# Patient Record
Sex: Male | Born: 1955 | Race: White | Hispanic: No | Marital: Married | Smoking: Never smoker
Health system: Southern US, Community
[De-identification: ages and names within clinical notes are randomized; demographics above are authoritative.]

## PROBLEM LIST (undated history)

## (undated) DIAGNOSIS — E785 Hyperlipidemia, unspecified: Secondary | ICD-10-CM

## (undated) DIAGNOSIS — I10 Essential (primary) hypertension: Secondary | ICD-10-CM

## (undated) HISTORY — PX: KNEE ARTHROSCOPY: SHX127

## (undated) HISTORY — PX: KNEE SURGERY: SHX244

## (undated) HISTORY — PX: TONSILLECTOMY: SUR1361

## (undated) HISTORY — PX: APPENDECTOMY: SHX54

---

## 2002-03-18 ENCOUNTER — Encounter: Admission: RE | Admit: 2002-03-18 | Discharge: 2002-03-18 | Payer: Self-pay | Admitting: Occupational Medicine

## 2002-03-18 ENCOUNTER — Encounter: Payer: Self-pay | Admitting: Occupational Medicine

## 2010-08-16 ENCOUNTER — Ambulatory Visit
Admission: RE | Admit: 2010-08-16 | Discharge: 2010-08-16 | Disposition: A | Discharge status: Home / Self Care | Payer: BC Managed Care – PPO | Source: Ambulatory Visit | Attending: Emergency Medicine | Admitting: Emergency Medicine

## 2010-08-16 ENCOUNTER — Inpatient Hospital Stay (INDEPENDENT_AMBULATORY_CARE_PROVIDER_SITE_OTHER)
Admission: RE | Admit: 2010-08-16 | Discharge: 2010-08-16 | Disposition: A | Discharge status: Home / Self Care | Payer: BC Managed Care – PPO | Source: Ambulatory Visit | Attending: Emergency Medicine | Admitting: Emergency Medicine

## 2010-08-16 ENCOUNTER — Other Ambulatory Visit: Payer: Self-pay | Admitting: Emergency Medicine

## 2010-08-16 ENCOUNTER — Encounter: Payer: Self-pay | Admitting: Emergency Medicine

## 2010-08-16 DIAGNOSIS — I10 Essential (primary) hypertension: Secondary | ICD-10-CM | POA: Insufficient documentation

## 2010-08-16 DIAGNOSIS — S40019A Contusion of unspecified shoulder, initial encounter: Secondary | ICD-10-CM

## 2010-08-16 DIAGNOSIS — M25519 Pain in unspecified shoulder: Secondary | ICD-10-CM

## 2010-08-16 DIAGNOSIS — E785 Hyperlipidemia, unspecified: Secondary | ICD-10-CM | POA: Insufficient documentation

## 2010-08-20 ENCOUNTER — Telehealth (INDEPENDENT_AMBULATORY_CARE_PROVIDER_SITE_OTHER): Payer: Self-pay | Admitting: *Deleted

## 2011-02-21 NOTE — Progress Notes (Signed)
Summary: LEFT SHOULDER INJ/PAIN (rm 4)   Vital Signs:  Patient Profile:   55 Years Old Male CC:      left shoulder pain x today Height:     69 inches Weight:      214 pounds O2 Sat:      97 % O2 treatment:    Room Air Temp:     98.9 degrees F oral Pulse rate:   59 / minute Resp:     16 per minute BP sitting:   135 / 88  (right arm) Cuff size:   large  Pt. in pain?   yes    Location:   left shoulder    Type:       sharp  Vitals Entered By: Lajean Saver RN (Aug 16, 2010 1:35 PM)                   Updated Prior Medication List: LIPITOR 20 MG TABS (ATORVASTATIN CALCIUM) once daily LISINOPRIL 40 MG TABS (LISINOPRIL) once daily * VITAMIN D   Current Allergies: No known allergies History of Present Illness History from: patient Chief Complaint: left shoulder pain x today History of Present Illness: Today at apprx noon, injured L shoulder area. Was repairing a car, and the car/spare tire leaned on his Left shoulder, wedging him to the ground. No LOC. Denies neck or head trauma or pain. Pt complains of L shoulder pain. Description/Quality of Pain: ache Intensity of pain: 5/10 Modifying Factors: Hurts to move left shoulder. Tried Ibuprofen, minimal help. Symptoms Worse with: movement Better with: rest Denies numbness, weakness. No other musculoskeletal c/o's. No cp or dyspnea.  REVIEW OF SYSTEMS Constitutional Symptoms      Denies fever, chills, night sweats, weight loss, weight gain, and fatigue.  Eyes       Denies change in vision, eye pain, eye discharge, glasses, contact lenses, and eye surgery. Ear/Nose/Throat/Mouth       Denies hearing loss/aids, change in hearing, ear pain, ear discharge, dizziness, frequent runny nose, frequent nose bleeds, sinus problems, sore throat, hoarseness, and tooth pain or bleeding.  Respiratory       Denies dry cough, productive cough, wheezing, shortness of breath, asthma, bronchitis, and emphysema/COPD.  Cardiovascular  Denies murmurs, chest pain, and tires easily with exhertion.    Gastrointestinal       Denies stomach pain, nausea/vomiting, diarrhea, constipation, blood in bowel movements, and indigestion. Genitourniary       Denies painful urination, kidney stones, and loss of urinary control. Neurological       Denies paralysis, seizures, and fainting/blackouts. Musculoskeletal       Complains of joint pain, joint stiffness, and decreased range of motion.      Denies muscle pain, redness, swelling, muscle weakness, and gout.      Comments: left shoulder Skin       Denies bruising, unusual mles/lumps or sores, and hair/skin or nail changes.  Psych       Denies mood changes, temper/anger issues, anxiety/stress, speech problems, depression, and sleep problems. Other Comments: Patient was working on his car today when the jack fell out and the car came downhitting his shoulder   Past History:  Past Medical History: Hyperlipidemia Hypertension  Past Surgical History: Appendectomy left knee  Social History: Married Never Smoked Alcohol use-no Drug use-no Smoking Status:  never Drug Use:  no Physical Exam General appearance: well developed, well nourished, no acute distress. Uncomfortable from L shoulder pain. Head: normocephalic, atraumatic Eyes: conjunctivae and lids  normal Oral/Pharynx: tongue normal, posterior pharynx without erythema or exudate Neck: neck supple,  trachea midline, no masses. No c-spine deformity or ttp. Neurological: grossly intact and non-focal Skin: no obvious rashes or lesions L shoulder: No deformity. + diffuse 2+ ttp L shoulder. +1 ttp ac joint. Otherwise, nontender over clavicle. ROM decreased from pain. No instability. Neurovascular distally intact. No eccymosis. Assessment New Problems: CONTUSION OF SHOULDER REGION (ICD-923.00) SHOULDER PAIN, LEFT (ICD-719.41) HYPERTENSION (ICD-401.9) HYPERLIPIDEMIA (ICD-272.4)  XRAY Left Shoulder: No fx. "Findings: There  is slight spurring and soft tissue calcification at the acromioclavicular joint which is most likely degenerative in origin.The glenohumeral joint is normal. IMPRESSION: Mild arthritic changes of the left AC joint."  Patient Education: Patient and/or caregiver instructed in the following: Ibuprofen prn. otc ibuprofen 600-800 mg by mouth q 8 hrs pc as needed for mild-moderate pain  Plan New Medications/Changes: VICODIN 5-500 MG TABS (HYDROCODONE-ACETAMINOPHEN) 1 by mouth every 4-6 hrs as needed for severe pain  #10 x 0, 08/16/2010, Lajean Manes MD  New Orders: T-DG Shoulder*L* [73030] New Patient Level III [phone] Services provided After hours-Weekends-Holidays [99051] Planning Comments:   Ice. He declined a sling. Rest shoulder for 2-3 days.  Follow Up: Follow up in 2-3 days if no improvement, Follow up with Primary Physician Follow Up: ortho if worse or new sxs  The patient and/or caregiver has been counseled thoroughly with regard to medications prescribed including dosage, schedule, interactions, rationale for use, and possible side effects and they verbalize understanding.  Diagnoses and expected course of recovery discussed and will return if not improved as expected or if the condition worsens. Patient and/or caregiver verbalized understanding.  Prescriptions: VICODIN 5-500 MG TABS (HYDROCODONE-ACETAMINOPHEN) 1 by mouth every 4-6 hrs as needed for severe pain  #10 x 0   Entered and Authorized by:   Lajean Manes MD   Signed by:   Lajean Manes MD on 08/16/2010   Method used:   Print then Give to Patient   RxID:   [id number]   Orders Added: 1)  T-DG Shoulder*L* [73030] 2)  New Patient Level III [95621] 3)  Services provided After hours-Weekends-Holidays [30865]

## 2011-02-21 NOTE — Telephone Encounter (Signed)
  Phone Note Outgoing Call Call back at [facility] Phone [phone]   Call placed by: Lajean Saver RN,  August 20, 2010 9:38 AM Call placed to: Patient Summary of Call: Callback: No answer. Message left to call with questions or concerns. See ortho if worsening symptoms, call if need referral. Use ice and rest.

## 2012-07-21 ENCOUNTER — Encounter: Payer: Self-pay | Admitting: *Deleted

## 2012-07-21 ENCOUNTER — Emergency Department (INDEPENDENT_AMBULATORY_CARE_PROVIDER_SITE_OTHER)
Admission: EM | Admit: 2012-07-21 | Discharge: 2012-07-21 | Disposition: A | Discharge status: Home / Self Care | Payer: BC Managed Care – PPO | Source: Home / Self Care | Attending: Family Medicine | Admitting: Family Medicine

## 2012-07-21 DIAGNOSIS — L237 Allergic contact dermatitis due to plants, except food: Secondary | ICD-10-CM

## 2012-07-21 DIAGNOSIS — L255 Unspecified contact dermatitis due to plants, except food: Secondary | ICD-10-CM

## 2012-07-21 HISTORY — DX: Essential (primary) hypertension: I10

## 2012-07-21 HISTORY — DX: Hyperlipidemia, unspecified: E78.5

## 2012-07-21 MED ORDER — PREDNISONE 50 MG PO TABS: ORAL_TABLET | ORAL | Status: DC

## 2012-07-21 MED ORDER — METHYLPREDNISOLONE SODIUM SUCC 125 MG IJ SOLR
125.0000 mg | Freq: Once | INTRAMUSCULAR | Status: AC
  Administered 2012-07-21: 125 mg via INTRAMUSCULAR

## 2012-07-21 NOTE — ED Provider Notes (Signed)
History     CSN: [id number]  Arrival date & time 07/21/12  1556   First MD Initiated Contact with Patient 07/21/12 1611      Chief Complaint  Patient presents with  . Poison Ivy    HPI  HPI  This patient complains of a RASH  Location: upper extremities and chest   Onset: 1 week   Course: progressive redness, irritation, itching. Known poison ivy/oak exposure  Self-treated with: calamine lotion   Improvement with treatment: no  History  Itching: yes  Tenderness: no  New medications/antibiotics: no  Pet exposure: no  Recent travel or tropical exposure: no  New soaps, shampoos, detergent, clothing: no  Tick/insect exposure: no  Chemical Exposure: no  Red Flags  Feeling ill: no  Fever: no  Facial/tongue swelling/difficulty breathing: no  Diabetic or immunocompromised: no    Past Medical History  Diagnosis Date  . Hypertension   . Hyperlipemia     Past Surgical History  Procedure Laterality Date  . Appendectomy    . Knee arthroscopy      Family History  Problem Relation Age of Onset  . Diabetes Mother   . Heart failure Mother   . Heart failure Father     History  Substance Use Topics  . Smoking status: Never Smoker   . Smokeless tobacco: Never Used  . Alcohol Use: No      Review of Systems  All other systems reviewed and are negative.    Allergies  Review of patient's allergies indicates no known allergies.  Home Medications   Current Outpatient Rx  Name  Route  Sig  Dispense  Refill  . atorvastatin (LIPITOR) 20 MG tablet   Oral   Take 20 mg by mouth daily.         . cholecalciferol (VITAMIN D) 1000 UNITS tablet   Oral   Take 1,000 Units by mouth daily.         . fenofibrate micronized (LOFIBRA) 200 MG capsule   Oral   Take 200 mg by mouth daily before breakfast.         . lisinopril (PRINIVIL,ZESTRIL) 40 MG tablet   Oral   Take 60 mg by mouth daily.         . predniSONE (DELTASONE) 50 MG tablet      1 tab daily x 7  days   7 tablet   0     BP 103/61  Pulse 69  Temp(Src) 98.1 F (36.7 C) (Oral)  Resp 16  Ht 5\' 9"  (1.753 m)  Wt 199 lb 4 oz (90.379 kg)  BMI 29.41 kg/m2  SpO2 98%  Physical Exam  Constitutional: He appears well-developed and well-nourished.  HENT:  Head: Normocephalic and atraumatic.  Eyes: Conjunctivae are normal. Pupils are equal, round, and reactive to light.  Neck: Normal range of motion.  Cardiovascular: Normal rate and regular rhythm.   Pulmonary/Chest: Effort normal.  Abdominal: Soft.  Neurological: He is alert.  Skin: Rash noted.     ED Course  Procedures (including critical care time)  Labs Reviewed - No data to display No results found.   1. Poison ivy dermatitis       MDM  Solumedrol 125mg  IM x1 Will place on course of prednisone.  Discussed infectious and derm red flags.  Follow up as needed.     The patient and/or caregiver has been counseled thoroughly with regard to treatment plan and/or medications prescribed including dosage, schedule, interactions, rationale for use, and  possible side effects and they verbalize understanding. Diagnoses and expected course of recovery discussed and will return if not improved as expected or if the condition worsens. Patient and/or caregiver verbalized understanding.             Doree Albee, MD 07/21/12 [phone]

## 2012-07-21 NOTE — ED Notes (Signed)
Pt c/o itching states he got poison ivy x 1 wk, present on arms, chest, and sides.

## 2013-05-08 ENCOUNTER — Emergency Department (INDEPENDENT_AMBULATORY_CARE_PROVIDER_SITE_OTHER): Payer: BC Managed Care – PPO

## 2013-05-08 ENCOUNTER — Emergency Department
Admission: EM | Admit: 2013-05-08 | Discharge: 2013-05-08 | Disposition: A | Discharge status: Home / Self Care | Payer: BC Managed Care – PPO | Source: Home / Self Care | Attending: Family Medicine | Admitting: Family Medicine

## 2013-05-08 ENCOUNTER — Encounter: Payer: Self-pay | Admitting: Emergency Medicine

## 2013-05-08 DIAGNOSIS — M47817 Spondylosis without myelopathy or radiculopathy, lumbosacral region: Secondary | ICD-10-CM

## 2013-05-08 DIAGNOSIS — M549 Dorsalgia, unspecified: Secondary | ICD-10-CM

## 2013-05-08 DIAGNOSIS — M533 Sacrococcygeal disorders, not elsewhere classified: Secondary | ICD-10-CM

## 2013-05-08 MED ORDER — PREDNISONE 20 MG PO TABS: 20.0000 mg | ORAL_TABLET | Freq: Two times a day (BID) | ORAL | Status: DC

## 2013-05-08 MED ORDER — MELOXICAM 15 MG PO TABS: 15.0000 mg | ORAL_TABLET | Freq: Every day | ORAL | Status: DC

## 2013-05-08 NOTE — ED Provider Notes (Signed)
CSN: [id number]     Arrival date & time 05/08/13  4098 History   First MD Initiated Contact with Patient 05/08/13 [phone]     Chief Complaint  Patient presents with  . Back Pain        HPI Comments: Five days ago patient developed a cramp-like pain in his right lower back radiating to his right anterior thigh. The pain is worse when walking.  No bowel or bladder dysfunction.  No saddle numbness.  He recalls no precipitating event. He states that he has had two episodes of sciatic pain in 1987 and 1991 that resolved spontaneously.  Patient is a 58 y.o. male presenting with back pain. The history is provided by the patient.  Back Pain Location:  Sacro-iliac joint Quality:  Cramping Radiates to:  R posterior upper leg Pain severity:  Moderate Pain is:  Same all the time Onset quality:  Sudden Duration:  5 days Timing:  Constant Progression:  Unchanged Chronicity:  Recurrent Context: not recent injury   Relieved by:  NSAIDs Exacerbated by: walking. Ineffective treatments:  None tried Associated symptoms: no abdominal pain, no abdominal swelling, no bladder incontinence, no bowel incontinence, no fever, no leg pain, no numbness, no paresthesias, no pelvic pain, no perianal numbness, no tingling, no weakness and no weight loss     Past Medical History  Diagnosis Date  . Hypertension   . Hyperlipemia    Past Surgical History  Procedure Laterality Date  . Appendectomy    . Knee arthroscopy    . Tonsillectomy     Family History  Problem Relation Age of Onset  . Diabetes Mother   . Heart failure Mother   . Hypertension Mother   . Heart failure Father   . Hypertension Father   . Diabetes Father    History  Substance Use Topics  . Smoking status: Never Smoker   . Smokeless tobacco: Never Used  . Alcohol Use: No    Review of Systems  Constitutional: Negative for fever and weight loss.  Gastrointestinal: Negative for abdominal pain and bowel incontinence.  Genitourinary:  Negative for bladder incontinence and pelvic pain.  Musculoskeletal: Positive for back pain.  Neurological: Negative for tingling, weakness, numbness and paresthesias.  All other systems reviewed and are negative.      Allergies  Review of patient's allergies indicates no known allergies.  Home Medications   Current Outpatient Rx  Name  Route  Sig  Dispense  Refill  . atorvastatin (LIPITOR) 20 MG tablet   Oral   Take 20 mg by mouth daily.         . cholecalciferol (VITAMIN D) 1000 UNITS tablet   Oral   Take 1,000 Units by mouth daily.         . fenofibrate micronized (LOFIBRA) 200 MG capsule   Oral   Take 200 mg by mouth daily before breakfast.         . lisinopril (PRINIVIL,ZESTRIL) 40 MG tablet   Oral   Take 60 mg by mouth daily.         . meloxicam (MOBIC) 15 MG tablet   Oral   Take 1 tablet (15 mg total) by mouth daily. Take with food each evening   10 tablet   0   . predniSONE (DELTASONE) 20 MG tablet   Oral   Take 1 tablet (20 mg total) by mouth 2 (two) times daily. Take with food.   10 tablet   0    BP 137/87  Pulse 70  Temp(Src) 97.9 F (36.6 C) (Oral)  Resp 18  Ht 5\' 9"  (1.753 m)  Wt 204 lb (92.534 kg)  BMI 30.11 kg/m2  SpO2 98% Physical Exam Nursing notes and Vital Signs reviewed. Appearance:  Patient appears healthy, stated age, and in no acute distress Eyes:  Pupils are equal, round, and reactive to light and accomodation.  Extraocular movement is intact.  Conjunctivae are not inflamed  Pharynx:  Normal Neck:  Supple.  No adenopathy Lungs:  Clear to auscultation.  Breath sounds are equal.  Heart:  Regular rate and rhythm without murmurs, rubs, or gallops.  Abdomen:  Nontender without masses or hepatosplenomegaly.  Bowel sounds are present.  No CVA or flank tenderness.  Extremities:  No edema.  No calf tenderness Skin:  No rash present.   Back:  Relatively good range of motion.  Can heel/toe walk and squat without difficulty.  Tenderness over the right SI joint.  Straight leg raising test is negative.  Sitting knee extension test is negative.  Strength and sensation in the lower extremities is normal.  Patellar and achilles reflexes are normal.  FABER positive for right SI joint  ED Course  Procedures  none    Imaging Review Dg Lumbar Spine Complete  05/08/2013   CLINICAL DATA:  Right-sided low back pain radiating right leg  EXAM: LUMBAR SPINE - COMPLETE 4+ VIEW  COMPARISON:  None.  FINDINGS: No acute abnormality such as fracture or evidence of infection. There is diffuse spondylotic endplate spurring and mild degenerative disc narrowing. No focally advanced disease. There is mild lower lumbar facet degeneration with minimal overgrowth. No spondylolisthesis or spondylolysis.  Abdominal aortic atherosclerosis. There is a calcification overlapping the lower pole of the right kidney which could represent stone or arterial calcification.  IMPRESSION: 1. No acute osseous abnormality. 2. Diffuse, mild lumbar spondylosis.   Electronically Signed   By: Tiburcio PeaJonathan  Watts M.D.   On: 05/08/2013 10:09      MDM   Final diagnoses:  Pain of right sacroiliac joint    Begin prednisone burst.  Rx for Mobic. Begin Mobic after finishing prednisone.  Apply ice pack to right lower back two or three times daily.  Begin range of motion and stretching exercises in about 5 days.  May continue Percocet at bedtime as needed for several days. Followup with Dr. Rodney Langtonhomas Thekkekand[facility] ([facility] if not improving about two weeks.     Lattie HawStephen A Vanna Shavers, MD 05/10/13 [phone]

## 2013-05-08 NOTE — Discharge Instructions (Signed)
Begin Mobic after finishing prednisone.  Apply ice pack to right lower back two or three times daily.  Begin range of motion and stretching exercises in about 5 days.  May continue Percocet at bedtime as needed for several days.   Back Injury Prevention The following tips can help you to prevent a back injury. PHYSICAL FITNESS  Exercise often. Try to develop strong stomach (abdominal) muscles.  Do aerobic exercises often. This includes walking, jogging, biking, swimming.  Do exercises that help with balance and strength often. This includes tai chi and yoga.  Stretch before and after you exercise.  Keep a healthy weight. DIET   Ask your doctor how much calcium and vitamin D you need every day.  Include calcium in your diet. Foods high in calcium include dairy products; green, leafy vegetables; and products with calcium added (fortified).  Include vitamin D in your diet. Foods high in vitamin D include milk and products with vitamin D added.  Think about taking a multivitamin or other nutritional products called " supplements."  Stop smoking if you smoke. POSTURE   Sit and stand up straight. Avoid leaning forward or hunching over.  Choose chairs that support your lower back.  If you work at a desk:  Sit close to your work so you do not lean over.  Keep your chin tucked in.  Keep your neck drawn back.  Keep your elbows bent at a right angle. Your arms should look like the letter "L."  Sit high and close to the steering wheel when you drive. Add low back support to your car seat if needed.  Avoid sitting or standing in one position for too long. Get up and move around every hour. Take breaks if you are driving for a long time.  Sleep on your side with your knees slightly bent. You can also sleep on your back with a pillow under your knees. Do not sleep on your stomach. LIFTING, TWISTING, AND REACHING  Avoid heavy lifting, especially lifting over and over again. If you  must do heavy lifting:  Stretch before lifting.  Work slowly.  Rest between lifts.  Use carts and dollies to move objects when possible.  Make several small trips instead of carrying 1 heavy load.  Ask for help when you need it.  Ask for help when moving big, awkward objects.  Follow these steps when lifting:  Stand with your feet shoulder-width apart.  Get as close to the object as you can. Do not pick up heavy objects that are far from your body.  Use handles or lifting straps when possible.  Bend at your knees. Squat down, but keep your heels off the floor.  Keep your shoulders back, your chin tucked in, and your back straight.  Lift the object slowly. Tighten the muscles in your legs, stomach, and butt. Keep the object as close to the center of your body as possible.  Reverse these directions when you put a load down.  Do not:  Lift the object above your waist.  Twist at the waist while lifting or carrying a load. Move your feet if you need to turn, not your waist.  Bend over without bending at your knees.  Avoid reaching over your head, across a table, or for an object on a high surface. OTHER TIPS  Avoid wet floors and keep sidewalks clear of ice.  Do not sleep on a mattress that is too soft or too hard.  Keep items that you use  often within easy reach.  Put heavier objects on shelves at waist level. Put lighter objects on lower or higher shelves.  Find ways to lessen your stress. You can try exercise, massage, or relaxation.  Get help for depression or anxiety if needed. GET HELP IF:  You injure your back.  You have questions about diet, exercise, or other ways to prevent back injuries. MAKE SURE YOU:  Understand these instructions.  Will watch your condition.  Will get help right away if you are not doing well or get worse. Document Released: 08/24/2007 Document Revised: 05/30/2011 Document Reviewed: 04/18/2011 [facility] Patient Information  2014 [place].

## 2013-05-08 NOTE — ED Notes (Signed)
Pt c/o RT sided LBP radiating down his RT leg x 5 days. He reports a hx of sciatica. He reports taking Aleve with no relief.

## 2013-09-02 ENCOUNTER — Emergency Department
Admission: EM | Admit: 2013-09-02 | Discharge: 2013-09-02 | Disposition: A | Discharge status: Home / Self Care | Payer: BC Managed Care – PPO | Source: Home / Self Care

## 2013-09-02 ENCOUNTER — Encounter: Payer: Self-pay | Admitting: Emergency Medicine

## 2013-09-02 ENCOUNTER — Ambulatory Visit (INDEPENDENT_AMBULATORY_CARE_PROVIDER_SITE_OTHER): Payer: BC Managed Care – PPO | Admitting: Sports Medicine

## 2013-09-02 DIAGNOSIS — M5416 Radiculopathy, lumbar region: Secondary | ICD-10-CM

## 2013-09-02 DIAGNOSIS — M940 Chondrocostal junction syndrome [Tietze]: Secondary | ICD-10-CM

## 2013-09-02 DIAGNOSIS — IMO0002 Reserved for concepts with insufficient information to code with codable children: Secondary | ICD-10-CM

## 2013-09-02 HISTORY — DX: Hyperlipidemia, unspecified: E78.5

## 2013-09-02 MED ORDER — PREDNISONE 50 MG PO TABS: ORAL_TABLET | ORAL | Status: DC

## 2013-09-02 MED ORDER — MELOXICAM 15 MG PO TABS: ORAL_TABLET | ORAL | Status: DC

## 2013-09-02 MED ORDER — CYCLOBENZAPRINE HCL 10 MG PO TABS: ORAL_TABLET | ORAL | Status: DC

## 2013-09-02 NOTE — Assessment & Plan Note (Signed)
Right-sided L5 distribution. Prednisone, Mobic, Flexeril at bedtime, formal PT. Return in a month, MRI for better. Restrictions given for work

## 2013-09-02 NOTE — Discharge Instructions (Signed)
Radicular Pain Radicular pain in either the arm or leg is usually from a bulging or herniated disk in the spine. A piece of the herniated disk may press against the nerves as the nerves exit the spine. This causes pain which is felt at the tips of the nerves down the arm or leg. Other causes of radicular pain may include:  Fractures.  Heart disease.  Cancer.  An abnormal and usually degenerative state of the nervous system or nerves (neuropathy). Diagnosis may require CT or MRI scanning to determine the primary cause.  Nerves that start at the neck (nerve roots) may cause radicular pain in the outer shoulder and arm. It can spread down to the thumb and fingers. The symptoms vary depending on which nerve root has been affected. In most cases radicular pain improves with conservative treatment. Neck problems may require physical therapy, a neck collar, or cervical traction. Treatment may take many weeks, and surgery may be considered if the symptoms do not improve.  Conservative treatment is also recommended for sciatica. Sciatica causes pain to radiate from the lower back or buttock area down the leg into the foot. Often there is a history of back problems. Most patients with sciatica are better after 2 to 4 weeks of rest and other supportive care. Short term bed rest can reduce the disk pressure considerably. Sitting, however, is not a good position since this increases the pressure on the disk. You should avoid bending, lifting, and all other activities which make the problem worse. Traction can be used in severe cases. Surgery is usually reserved for patients who do not improve within the first months of treatment. Only take over-the-counter or prescription medicines for pain, discomfort, or fever as directed by your caregiver. Narcotics and muscle relaxants may help by relieving more severe pain and spasm and by providing mild sedation. Cold or massage can give significant relief. Spinal manipulation  is not recommended. It can increase the degree of disc protrusion. Epidural steroid injections are often effective treatment for radicular pain. These injections deliver medicine to the spinal nerve in the space between the protective covering of the spinal cord and back bones (vertebrae). Your caregiver can give you more information about steroid injections. These injections are most effective when given within two weeks of the onset of pain.  You should see your caregiver for follow up care as recommended. A program for neck and back injury rehabilitation with stretching and strengthening exercises is an important part of management.  SEEK IMMEDIATE MEDICAL CARE IF:  You develop increased pain, weakness, or numbness in your arm or leg.  You develop difficulty with bladder or bowel control.  You develop abdominal pain. Document Released: 04/14/2004 Document Revised: 05/30/2011 Document Reviewed: 06/30/2008 [employer] Patient Information 2014 [employer].  

## 2013-09-02 NOTE — Progress Notes (Signed)
   Subjective:    I'm seeing this patient as a consultation for:  Dr. Cathren HarshBeese  CC: Low back pain  HPI: This is a pleasant 58 year-old male, he has a history several years ago of a left-sided L4 radiculopathy, for the past several weeks he's had pain he localizes in the right side of the low back radiating down the right leg in an L5 distribution, worse with sitting, flexion, and Valsalva. No bowel or bladder dysfunction, no constitutional symptoms. No saddle numbness. No trauma. Pain is moderate, persistent.  Past medical history, Surgical history, Family history not pertinant except as noted below, Social history, Allergies, and medications have been entered into the medical record, reviewed, and no changes needed.   Review of Systems: No headache, visual changes, nausea, vomiting, diarrhea, constipation, dizziness, abdominal pain, skin rash, fevers, chills, night sweats, weight loss, swollen lymph nodes, body aches, joint swelling, muscle aches, chest pain, shortness of breath, mood changes, visual or auditory hallucinations.   Objective:   General: Well Developed, well nourished, and in no acute distress.  Neuro/Psych: Alert and oriented x3, extra-ocular muscles intact, able to move all 4 extremities, sensation grossly intact. Skin: Warm and dry, no rashes noted.  Respiratory: Not using accessory muscles, speaking in full sentences, trachea midline.  Cardiovascular: Pulses palpable, no extremity edema. Abdomen: Does not appear distended. Back Exam:  Inspection: Unremarkable  Motion: Flexion 45 deg, Extension 45 deg, Side Bending to 45 deg bilaterally,  Rotation to 45 deg bilaterally  SLR laying: Positive  XSLR laying: Negative  Palpable tenderness: None. FABER: negative. Sensory change: Gross sensation intact to all lumbar and sacral dermatomes.  Reflexes: 2+ at both patellar tendons, 2+ at achilles tendons, Babinski's downgoing.  Strength at foot  Plantar-flexion: 5/5 Dorsi-flexion:  5/5 Eversion: 5/5 Inversion: 5/5  Leg strength  Quad: 5/5 Hamstring: 5/5 Hip flexor: 5/5 Hip abductors: 5/5  Gait unremarkable.  Impression and Recommendations:   This case required medical decision making of moderate complexity.

## 2013-09-02 NOTE — ED Notes (Signed)
Right hip pain radiates into calf since Friday

## 2013-09-02 NOTE — ED Provider Notes (Signed)
CSN: [id number]     Arrival date & time 09/02/13  1513 History   None    Chief Complaint  Patient presents with  . Hip Pain      HPI Comments: Patient complains of onset of dull ache in his right hip and lower back that started three days ago.  He recalls no trauma but reports that he was cutting wood last week.  The pain now radiates into his right thigh, knee, and lower leg with a sensation of tingling on the lateral aspect of his right knee.  No bowel or bladder dysfunction.  No saddle numbness.  He had a similar episode of lower back pain earlier this year which did not radiate, and resolved with prednisone.  Patient is a 58 y.o. male presenting with hip pain. The history is provided by the patient.  Hip Pain This is a new problem. Episode onset: 3 days ago. The problem occurs constantly. The problem has been gradually worsening. Pertinent negatives include no abdominal pain. The symptoms are aggravated by bending, walking and twisting. Nothing relieves the symptoms. Treatments tried: Meloxicam. The treatment provided mild relief.    Past Medical History  Diagnosis Date  . Hypertension   . Hyperlipemia   . Hyperlipidemia    Past Surgical History  Procedure Laterality Date  . Appendectomy    . Knee arthroscopy    . Tonsillectomy     Family History  Problem Relation Age of Onset  . Diabetes Mother   . Heart failure Mother   . Hypertension Mother   . Heart failure Father   . Hypertension Father   . Diabetes Father    History  Substance Use Topics  . Smoking status: Never Smoker   . Smokeless tobacco: Never Used  . Alcohol Use: No    Review of Systems  Constitutional: Negative for fever, chills, activity change and unexpected weight change.  HENT: Negative.   Respiratory: Negative.   Cardiovascular: Negative.   Gastrointestinal: Negative.  Negative for abdominal pain.  Genitourinary: Negative.   Musculoskeletal: Positive for back pain.  Skin: Negative.     Neurological: Positive for numbness.    Allergies  Review of patient's allergies indicates not on file.  Home Medications   Prior to Admission medications   Medication Sig Start Date End Date Taking? Authorizing Provider  oxyCODONE-acetaminophen (PERCOCET) 10-325 MG per tablet Take 1 tablet by mouth every 4 (four) hours as needed for pain.   Yes Historical Provider, MD  atorvastatin (LIPITOR) 20 MG tablet Take 20 mg by mouth daily.    Historical Provider, MD  cholecalciferol (VITAMIN D) 1000 UNITS tablet Take 1,000 Units by mouth daily.    Historical Provider, MD  cyclobenzaprine (FLEXERIL) 10 MG tablet One half tab PO qHS, then increase gradually to one tab TID. 09/02/13   Monica Bectonhomas J Thekkekandam, MD  fenofibrate micronized (LOFIBRA) 200 MG capsule Take 200 mg by mouth daily before breakfast.    Historical Provider, MD  lisinopril (PRINIVIL,ZESTRIL) 40 MG tablet Take 60 mg by mouth daily.    Historical Provider, MD  meloxicam (MOBIC) 15 MG tablet One tab PO qAM with breakfast for 2 weeks, then daily prn pain. 09/02/13   Monica Bectonhomas J Thekkekandam, MD  predniSONE (DELTASONE) 50 MG tablet One tab PO daily for 5 days. 09/02/13   Monica Bectonhomas J Thekkekandam, MD   BP 162/101  Pulse 52  Temp(Src) 98.6 F (37 C) (Oral)  Ht 5\' 8"  (1.727 m)  Wt 206 lb (93.441 kg)  BMI 31.33 kg/m2  SpO2 99% Physical Exam  Nursing note and vitals reviewed. Constitutional: He is oriented to person, place, and time. He appears well-developed and well-nourished. No distress.  HENT:  Head: Atraumatic.  Mouth/Throat: Oropharynx is clear and moist.  Eyes: Conjunctivae are normal. Pupils are equal, round, and reactive to light.  Cardiovascular: Normal heart sounds.   Pulmonary/Chest: Breath sounds normal.  Abdominal: Bowel sounds are normal. There is no tenderness.  Musculoskeletal:       Lumbar back: He exhibits tenderness. He exhibits no swelling.       Back:  Back has decreased range of motion.  Tenderness  In the right  paraspinous muscles from L3 to L5.  Straight leg raising test is positive at about 30 degrees.  Sitting knee extension test is positive.  Strength and sensation in the lower extremities is normal.  Patellar reflexes are normal with augmentation.  Achilles reflexes are diminished.  Neurological: He is alert and oriented to person, place, and time.  Skin: Skin is warm and dry. No rash noted.    ED Course  Procedures        Imaging Review:  Lumbar spine X-rays done 05/08/13:  IMPRESSION:  1. No acute osseous abnormality.  2. Diffuse, mild lumbar spondylosis.     MDM   1. Lumbar back pain with radiculopathy affecting right lower extremity   2. Costochondritis    Because of progression of symptoms with radiculopathy, will refer to Dr. Rodney Langtonhomas Thekkekandam for further evaluation and management.    Lattie HawStephen A Azul Brumett, MD 09/02/13 [phone]

## 2013-09-06 ENCOUNTER — Ambulatory Visit (INDEPENDENT_AMBULATORY_CARE_PROVIDER_SITE_OTHER): Payer: BC Managed Care – PPO | Admitting: Physical Therapy

## 2013-09-06 ENCOUNTER — Telehealth: Payer: Self-pay | Admitting: Sports Medicine

## 2013-09-06 DIAGNOSIS — M6281 Muscle weakness (generalized): Secondary | ICD-10-CM

## 2013-09-06 DIAGNOSIS — M545 Low back pain, unspecified: Secondary | ICD-10-CM

## 2013-09-06 DIAGNOSIS — R5381 Other malaise: Secondary | ICD-10-CM

## 2013-09-06 DIAGNOSIS — IMO0002 Reserved for concepts with insufficient information to code with codable children: Secondary | ICD-10-CM

## 2013-09-06 NOTE — Telephone Encounter (Signed)
Letter is in my box 

## 2013-09-06 NOTE — Telephone Encounter (Signed)
Patient needs updated work note

## 2013-09-06 NOTE — Telephone Encounter (Signed)
Left detailed message on patient home vm that letter was ready for pickup. Rhonda Cunningham,CMA

## 2013-09-06 NOTE — Telephone Encounter (Signed)
Please see note below. Rhonda Cunningham,CMA  

## 2013-09-09 ENCOUNTER — Encounter (INDEPENDENT_AMBULATORY_CARE_PROVIDER_SITE_OTHER): Payer: BC Managed Care – PPO | Admitting: Physical Therapy

## 2013-09-09 DIAGNOSIS — M6281 Muscle weakness (generalized): Secondary | ICD-10-CM

## 2013-09-09 DIAGNOSIS — R5381 Other malaise: Secondary | ICD-10-CM

## 2013-09-09 DIAGNOSIS — IMO0002 Reserved for concepts with insufficient information to code with codable children: Secondary | ICD-10-CM

## 2013-09-12 ENCOUNTER — Encounter: Payer: BC Managed Care – PPO | Admitting: Physical Therapy

## 2013-09-12 DIAGNOSIS — M545 Low back pain, unspecified: Secondary | ICD-10-CM

## 2013-09-12 DIAGNOSIS — M6281 Muscle weakness (generalized): Secondary | ICD-10-CM

## 2013-09-12 DIAGNOSIS — IMO0002 Reserved for concepts with insufficient information to code with codable children: Secondary | ICD-10-CM

## 2013-09-12 DIAGNOSIS — R5381 Other malaise: Secondary | ICD-10-CM

## 2013-09-13 ENCOUNTER — Encounter (INDEPENDENT_AMBULATORY_CARE_PROVIDER_SITE_OTHER): Payer: BC Managed Care – PPO | Admitting: Physical Therapy

## 2013-09-16 ENCOUNTER — Encounter (INDEPENDENT_AMBULATORY_CARE_PROVIDER_SITE_OTHER): Payer: BC Managed Care – PPO | Admitting: Physical Therapy

## 2013-09-16 DIAGNOSIS — M6281 Muscle weakness (generalized): Secondary | ICD-10-CM

## 2013-09-16 DIAGNOSIS — R5381 Other malaise: Secondary | ICD-10-CM

## 2013-09-16 DIAGNOSIS — M545 Low back pain, unspecified: Secondary | ICD-10-CM

## 2013-09-16 DIAGNOSIS — IMO0002 Reserved for concepts with insufficient information to code with codable children: Secondary | ICD-10-CM

## 2013-09-17 ENCOUNTER — Encounter (INDEPENDENT_AMBULATORY_CARE_PROVIDER_SITE_OTHER): Payer: BC Managed Care – PPO | Admitting: Physical Therapy

## 2013-09-17 DIAGNOSIS — M545 Low back pain, unspecified: Secondary | ICD-10-CM

## 2013-09-17 DIAGNOSIS — R5381 Other malaise: Secondary | ICD-10-CM

## 2013-09-17 DIAGNOSIS — IMO0002 Reserved for concepts with insufficient information to code with codable children: Secondary | ICD-10-CM

## 2013-09-17 DIAGNOSIS — M6281 Muscle weakness (generalized): Secondary | ICD-10-CM

## 2013-09-19 ENCOUNTER — Encounter: Payer: BC Managed Care – PPO | Admitting: Physical Therapy

## 2013-09-23 ENCOUNTER — Encounter (INDEPENDENT_AMBULATORY_CARE_PROVIDER_SITE_OTHER): Payer: BC Managed Care – PPO | Admitting: Physical Therapy

## 2013-09-23 DIAGNOSIS — M545 Low back pain, unspecified: Secondary | ICD-10-CM

## 2013-09-23 DIAGNOSIS — IMO0002 Reserved for concepts with insufficient information to code with codable children: Secondary | ICD-10-CM

## 2013-09-23 DIAGNOSIS — M6281 Muscle weakness (generalized): Secondary | ICD-10-CM

## 2013-09-23 DIAGNOSIS — R5381 Other malaise: Secondary | ICD-10-CM

## 2013-09-26 ENCOUNTER — Encounter: Payer: BC Managed Care – PPO | Admitting: Physical Therapy

## 2013-09-30 ENCOUNTER — Encounter (INDEPENDENT_AMBULATORY_CARE_PROVIDER_SITE_OTHER): Payer: BC Managed Care – PPO | Admitting: Physical Therapy

## 2013-09-30 DIAGNOSIS — R5381 Other malaise: Secondary | ICD-10-CM

## 2013-09-30 DIAGNOSIS — M545 Low back pain, unspecified: Secondary | ICD-10-CM

## 2013-09-30 DIAGNOSIS — IMO0002 Reserved for concepts with insufficient information to code with codable children: Secondary | ICD-10-CM

## 2013-10-03 ENCOUNTER — Ambulatory Visit (INDEPENDENT_AMBULATORY_CARE_PROVIDER_SITE_OTHER): Payer: BC Managed Care – PPO | Admitting: Sports Medicine

## 2013-10-03 ENCOUNTER — Encounter: Payer: BC Managed Care – PPO | Admitting: Physical Therapy

## 2013-10-03 ENCOUNTER — Encounter: Payer: Self-pay | Admitting: Sports Medicine

## 2013-10-03 VITALS — BP 124/77 | HR 62 | Wt 206.0 lb

## 2013-10-03 DIAGNOSIS — M5416 Radiculopathy, lumbar region: Secondary | ICD-10-CM

## 2013-10-03 DIAGNOSIS — IMO0002 Reserved for concepts with insufficient information to code with codable children: Secondary | ICD-10-CM

## 2013-10-03 NOTE — Assessment & Plan Note (Signed)
Symptoms were in the right-sided L5 distribution. They have resolved with physical therapy, NSAIDs, muscle relaxers, and prednisone. Continue home rehabilitation exercises, return to see me as needed. May return to work, we can add Elavil if pain returns versus MRI for intervention.

## 2013-10-03 NOTE — Progress Notes (Signed)
  Subjective:    CC: Followup  HPI: Right lumbar radiculopathy colon resolved with conservative measures.  Past medical history, Surgical history, Family history not pertinant except as noted below, Social history, Allergies, and medications have been entered into the medical record, reviewed, and no changes needed.   Review of Systems: No fevers, chills, night sweats, weight loss, chest pain, or shortness of breath.   Objective:    General: Well Developed, well nourished, and in no acute distress.  Neuro: Alert and oriented x3, extra-ocular muscles intact, sensation grossly intact.  HEENT: Normocephalic, atraumatic, pupils equal round reactive to light, neck supple, no masses, no lymphadenopathy, thyroid nonpalpable.  Skin: Warm and dry, no rashes. Cardiac: Regular rate and rhythm, no murmurs rubs or gallops, no lower extremity edema.  Respiratory: Clear to auscultation bilaterally. Not using accessory muscles, speaking in full sentences. Back Exam:  Inspection: Unremarkable  Motion: Flexion 45 deg, Extension 45 deg, Side Bending to 45 deg bilaterally,  Rotation to 45 deg bilaterally  SLR laying: Negative  XSLR laying: Negative  Palpable tenderness: None. FABER: negative. Sensory change: Gross sensation intact to all lumbar and sacral dermatomes.  Reflexes: 2+ at both patellar tendons, 2+ at achilles tendons, Babinski's downgoing.  Strength at foot  Plantar-flexion: 5/5 Dorsi-flexion: 5/5 Eversion: 5/5 Inversion: 5/5  Leg strength  Quad: 5/5 Hamstring: 5/5 Hip flexor: 5/5 Hip abductors: 5/5  Gait unremarkable.  Impression and Recommendations:

## 2016-04-11 ENCOUNTER — Emergency Department
Admission: EM | Admit: 2016-04-11 | Discharge: 2016-04-11 | Disposition: A | Discharge status: Home / Self Care | Payer: BLUE CROSS/BLUE SHIELD | Source: Home / Self Care | Attending: Family Medicine | Admitting: Family Medicine

## 2016-04-11 ENCOUNTER — Emergency Department: Payer: BLUE CROSS/BLUE SHIELD

## 2016-04-11 ENCOUNTER — Encounter: Payer: Self-pay | Admitting: Emergency Medicine

## 2016-04-11 DIAGNOSIS — M79661 Pain in right lower leg: Secondary | ICD-10-CM | POA: Diagnosis not present

## 2016-04-11 DIAGNOSIS — M7121 Synovial cyst of popliteal space [Baker], right knee: Secondary | ICD-10-CM

## 2016-04-11 NOTE — ED Provider Notes (Signed)
CSN: [id number]     Arrival date & time 04/11/16  1315 History   First MD Initiated Contact with Patient 04/11/16 1402     Chief Complaint  Patient presents with  . Leg Pain   (Consider location/radiation/quality/duration/timing/severity/associated sxs/prior Treatment) HPI  Jeremy Gallegos is a 61 y.o. male presenting to UC with c/o Right calf pain that is aching and sore, worse along medial aspect and behind his knee.  Pain is 3/10 at rest but 7/10 when walking around.  Denies known injury. No hx of blood clots but his parents have had clots so pt is concerned.  Denies chest pain or SOB.     Past Medical History:  Diagnosis Date  . Hyperlipemia   . Hyperlipidemia   . Hypertension    Past Surgical History:  Procedure Laterality Date  . APPENDECTOMY    . KNEE ARTHROSCOPY    . TONSILLECTOMY     Family History  Problem Relation Age of Onset  . Diabetes Mother   . Heart failure Mother   . Hypertension Mother   . Heart failure Father   . Hypertension Father   . Diabetes Father    Social History  Substance Use Topics  . Smoking status: Never Smoker  . Smokeless tobacco: Never Used  . Alcohol use No    Review of Systems  Respiratory: Negative for shortness of breath.   Cardiovascular: Negative for chest pain and palpitations.  Musculoskeletal: Positive for arthralgias and myalgias. Negative for joint swelling.  Skin: Negative for color change and wound.  Neurological: Negative for weakness and numbness.    Allergies  Patient has no known allergies.  Home Medications   Prior to Admission medications   Medication Sig Start Date End Date Taking? Authorizing Provider  atorvastatin (LIPITOR) 20 MG tablet Take 20 mg by mouth daily.    Historical Provider, MD  cholecalciferol (VITAMIN D) 1000 UNITS tablet Take 1,000 Units by mouth daily.    Historical Provider, MD  fenofibrate micronized (LOFIBRA) 200 MG capsule Take 200 mg by mouth daily before breakfast.    Historical  Provider, MD  lisinopril (PRINIVIL,ZESTRIL) 40 MG tablet Take 60 mg by mouth daily.    Historical Provider, MD  meloxicam (MOBIC) 15 MG tablet One tab PO qAM with breakfast for 2 weeks, then daily prn pain. 09/02/13   Monica Bectonhomas J Thekkekandam, MD  oxyCODONE-acetaminophen (PERCOCET) 10-325 MG per tablet Take 1 tablet by mouth every 4 (four) hours as needed for pain.    Historical Provider, MD   Meds Ordered and Administered this Visit  Medications - No data to display  BP 113/72 (BP Location: Left Arm)   Pulse 63   Temp 98 F (36.7 C) (Oral)   Ht 5\' 9"  (1.753 m)   Wt 217 lb (98.4 kg)   SpO2 97%   BMI 32.05 kg/m  No data found.   Physical Exam  Constitutional: He is oriented to person, place, and time. He appears well-developed and well-nourished. No distress.  HENT:  Head: Normocephalic and atraumatic.  Eyes: EOM are normal.  Neck: Normal range of motion.  Cardiovascular: Normal rate.   Pulmonary/Chest: Effort normal.  Musculoskeletal: Normal range of motion. He exhibits tenderness. He exhibits no edema.  Right knee and lower leg: no edema or deformity. Mild tenderness to medial and posterior aspect of calf and knee. Full ROM knee and ankle.   Neurological: He is alert and oriented to person, place, and time.  Skin: Skin is warm and dry.  He is not diaphoretic. No erythema.  Right knee and lower leg: skin in tact. No ecchymosis or erythema.   Psychiatric: He has a normal mood and affect. His behavior is normal.  Nursing note and vitals reviewed.   Urgent Care Course     Procedures (including critical care time)  Labs Review Labs Reviewed - No data to display  Imaging Review US Venous Img Lower Unilateral Right  Result Date: 04/11/2016 CLINICAL DATA:  61 year old male with a history of right calf pain EXAM: RIGHT LOWER EXTREMITY VENOUS DOPPLER ULTRASOUND TECHNIQUE: Gray-scale sonography with graded compression, as well as color Doppler and duplex ultrasound were performed to  evaluate the lower extremity deep venous systems from the level of the common femoral vein and including the common femoral, femoral, profunda femoral, popliteal and calf veins including the posterior tibial, peroneal and gastrocnemius veins when visible. The superficial great saphenous vein was also interrogated. Spectral Doppler was utilized to evaluate flow at rest and with distal augmentation maneuvers in the common femoral, femoral and popliteal veins. COMPARISON:  None. FINDINGS: Contralateral Common Femoral Vein: Respiratory phasicity is normal and symmetric with the symptomatic side. No evidence of thrombus. Normal compressibility. Common Femoral Vein: No evidence of thrombus. Normal compressibility, respiratory phasicity and response to augmentation. Saphenofemoral Junction: No evidence of thrombus. Normal compressibility and flow on color Doppler imaging. Profunda Femoral Vein: No evidence of thrombus. Normal compressibility and flow on color Doppler imaging. Femoral Vein: No evidence of thrombus. Normal compressibility, respiratory phasicity and response to augmentation. Popliteal Vein: No evidence of thrombus. Normal compressibility, respiratory phasicity and response to augmentation. Calf Veins: No evidence of thrombus. Normal compressibility and flow on color Doppler imaging. Superficial Great Saphenous Vein: No evidence of thrombus. Normal compressibility and flow on color Doppler imaging. Other Findings: Complex anechoic structure in the right popliteal region, most likely complex Baker's cyst. Collection measures 6.9 cm x 3.5 cm x 3.2 cm IMPRESSION: Sonographic survey of the right lower extremity negative for DVT. Complex collection in the right popliteal region, likely a Baker cyst. Signed, Yvone Neu. Loreta Ave, DO [facility] Electronically Signed   By: Gilmer Mor D.O.   On: 04/11/2016 16:07     MDM   1. Right calf pain   2. Synovial  cyst of right popliteal space    Pt c/o Right calf and posterior knee pain for about 3 days.  U/S c/w Baker's cyst. No evidence of underlying infection.     Junius Finner, PA-C 04/11/16 [phone]

## 2016-04-11 NOTE — ED Triage Notes (Signed)
Right calf pain, x 3 days, denies trauma, aching pain, not hot to touch but feels warm sensation while lying in bed at night.

## 2017-11-20 IMAGING — US US EXTREM LOW VENOUS*R*
1 series · 13 of 24 positions shown · non-contrast
Comparison: None.

CLINICAL DATA: 60-year-old male with a history of right calf pain



[Series 1: us extrem low venous*right* · 0.08mm/px · 13 of 37 slices shown]
[im 1/37]
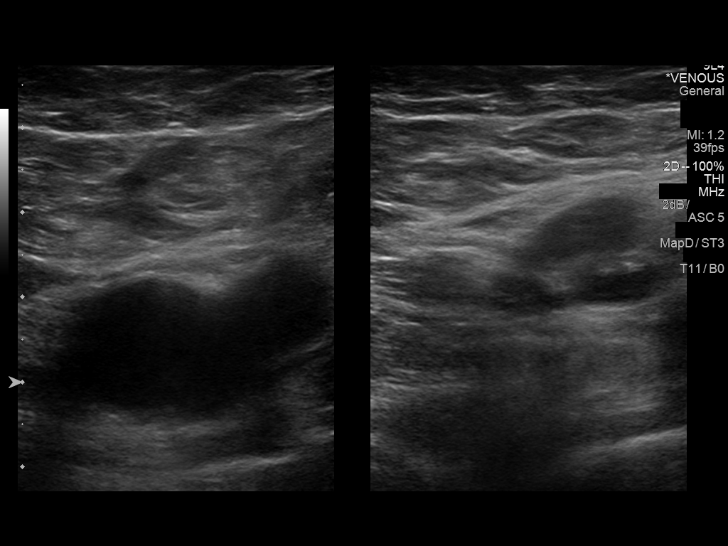
[im 4/37]
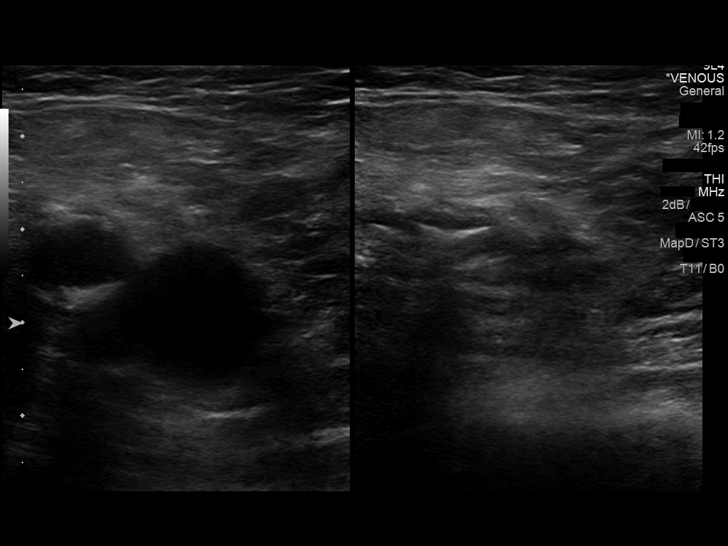
[im 7/37]
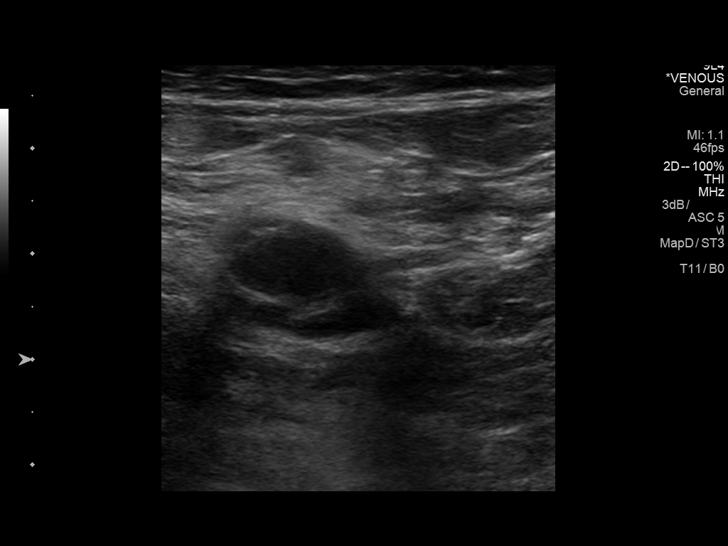
[im 10/37]
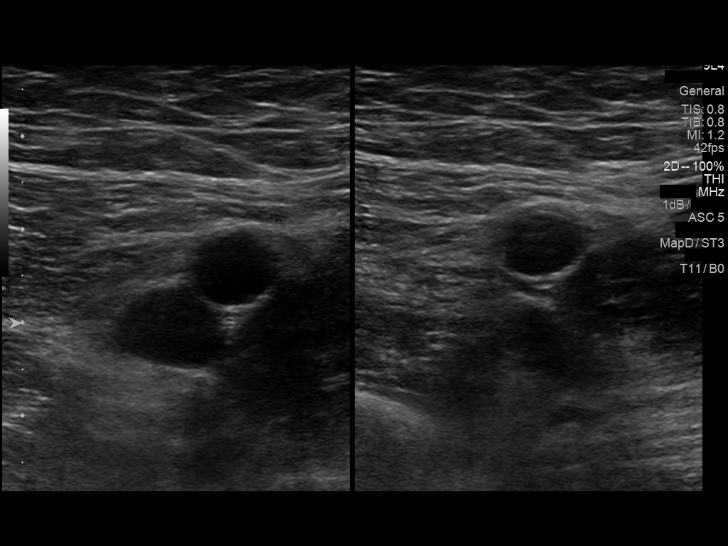
[im 13/37]
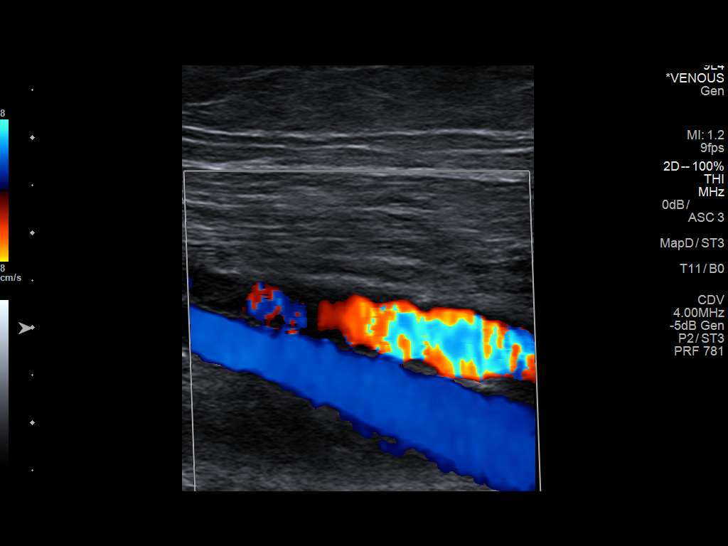
[im 16/37]
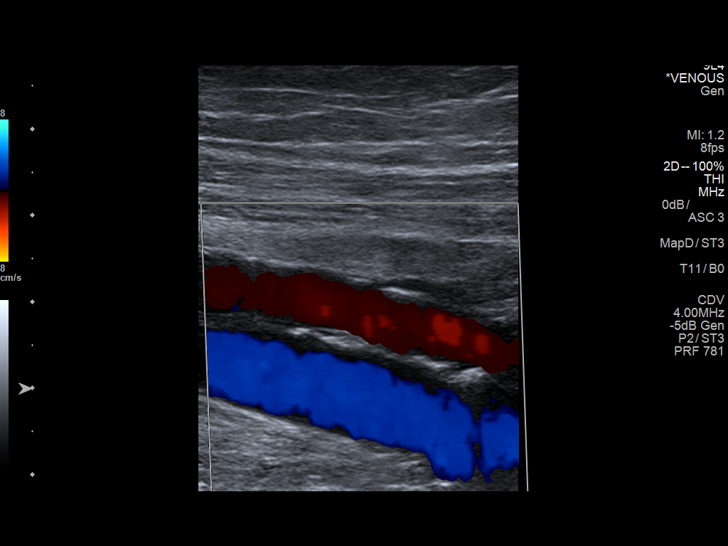
[im 19/37]
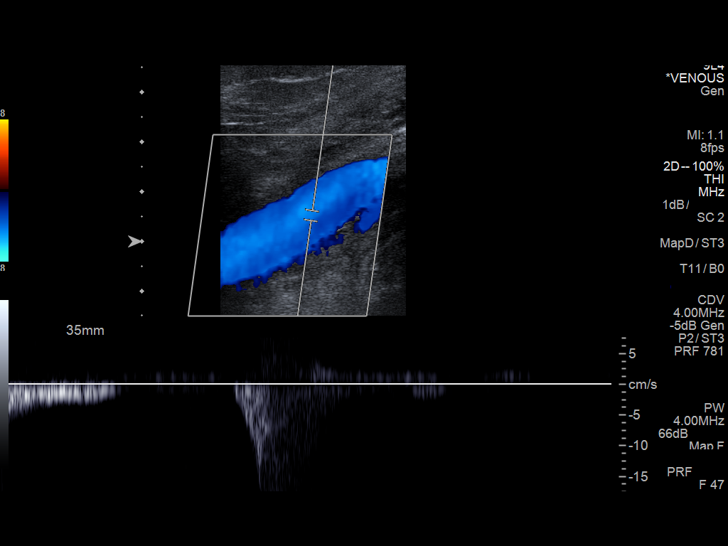
[im 21/37]
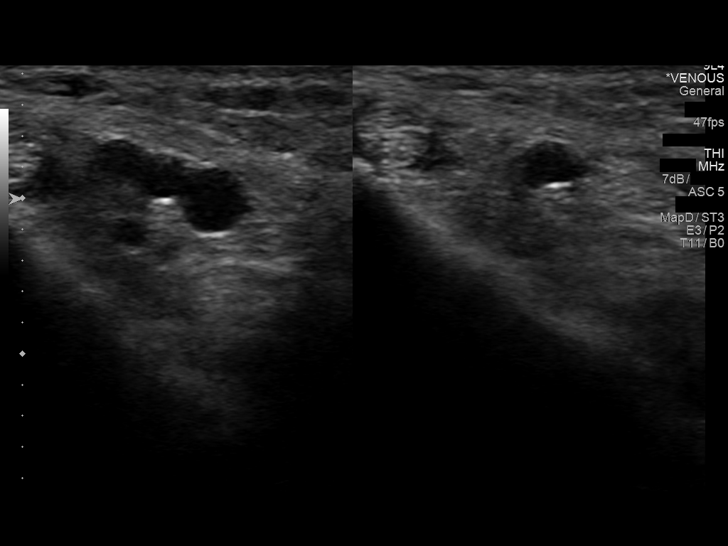
[im 24/37]
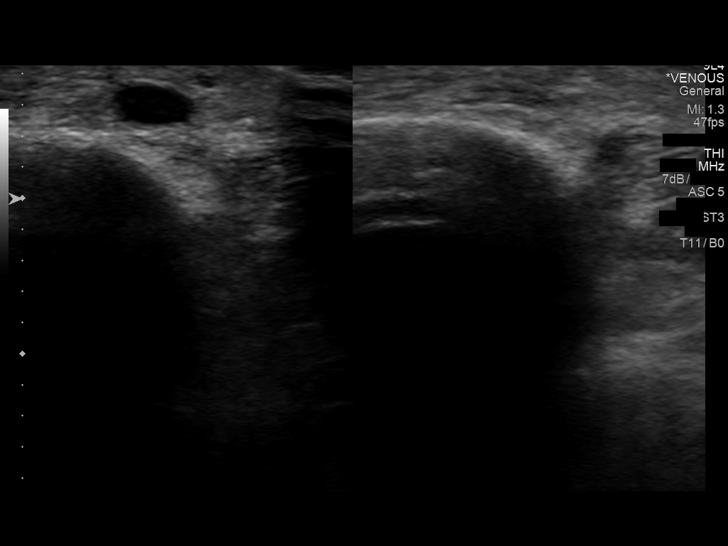
[im 27/37]
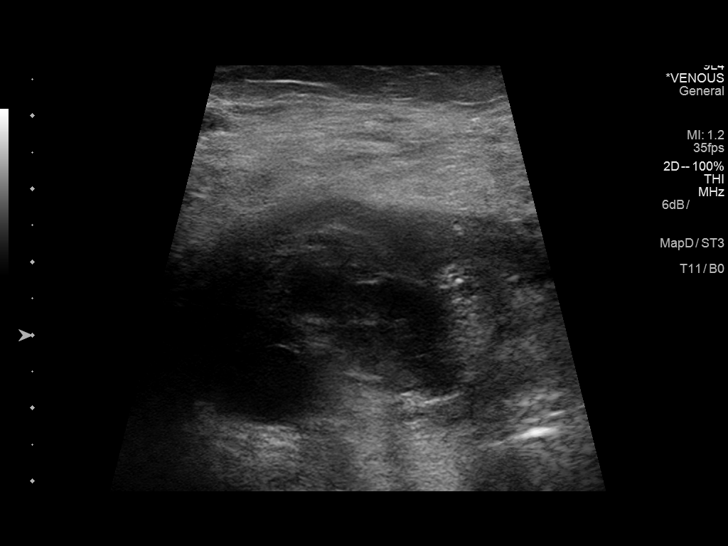
[im 30/37]
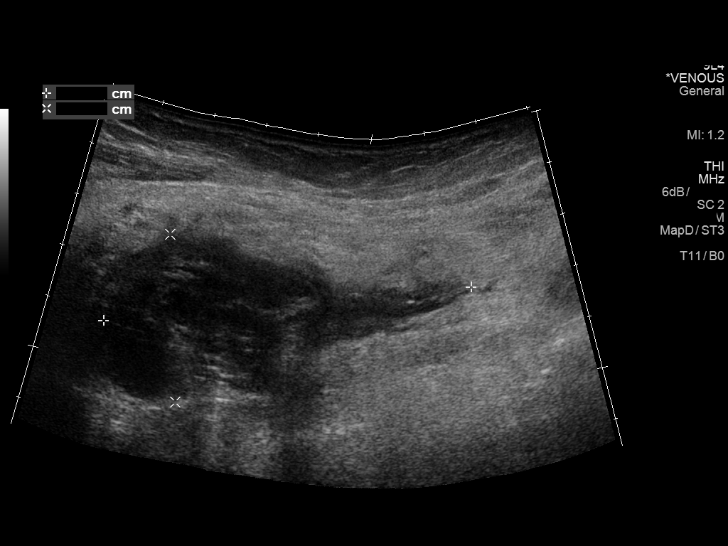
[im 33/37]
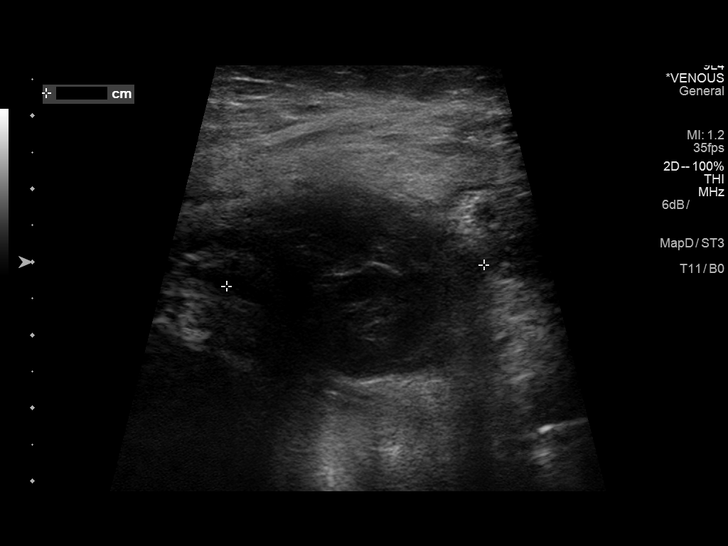
[im 37/37]
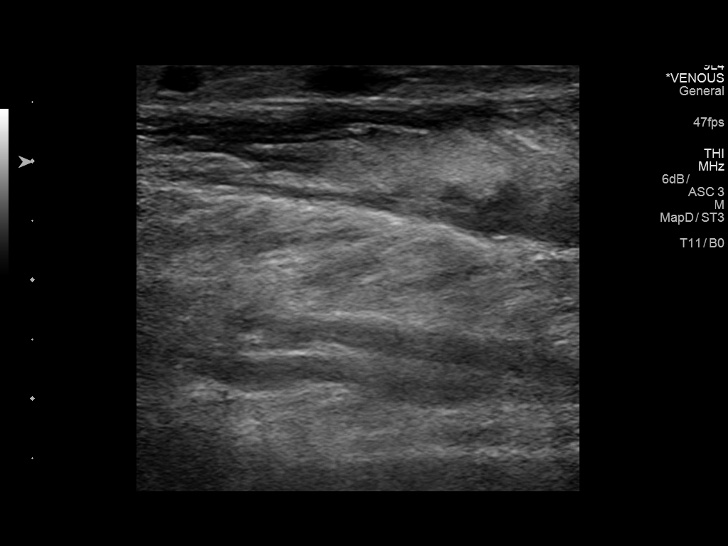

[13 of 24 positions shown; findings below may reference images not displayed]

FINDINGS: Contralateral Common Femoral Vein: Respiratory phasicity is normal
and symmetric with the symptomatic side. No evidence of thrombus.
Normal compressibility.

Common Femoral Vein: No evidence of thrombus. Normal
compressibility, respiratory phasicity and response to augmentation.

Saphenofemoral Junction: No evidence of thrombus. Normal
compressibility and flow on color Doppler imaging.

Profunda Femoral Vein: No evidence of thrombus. Normal
compressibility and flow on color Doppler imaging.

Femoral Vein: No evidence of thrombus. Normal compressibility,
respiratory phasicity and response to augmentation.

Popliteal Vein: No evidence of thrombus. Normal compressibility,
respiratory phasicity and response to augmentation.

Calf Veins: No evidence of thrombus. Normal compressibility and flow
on color Doppler imaging.

Superficial Great Saphenous Vein: No evidence of thrombus. Normal
compressibility and flow on color Doppler imaging.

Other Findings: Complex anechoic structure in the right popliteal
region, most likely complex Baker's cyst. Collection measures 6.9 cm
x 3.5 cm x 3.2 cm
IMPRESSION: Sonographic survey of the right lower extremity negative for DVT.

Complex collection in the right popliteal region, likely a Baker
cyst.

## 2020-09-27 ENCOUNTER — Encounter: Payer: Self-pay | Admitting: Emergency Medicine

## 2020-09-27 ENCOUNTER — Emergency Department
Admission: EM | Admit: 2020-09-27 | Discharge: 2020-09-27 | Disposition: A | Discharge status: Home / Self Care | Payer: BLUE CROSS/BLUE SHIELD | Source: Home / Self Care | Attending: Family Medicine | Admitting: Family Medicine

## 2020-09-27 ENCOUNTER — Other Ambulatory Visit: Payer: Self-pay

## 2020-09-27 DIAGNOSIS — T464X5A Adverse effect of angiotensin-converting-enzyme inhibitors, initial encounter: Secondary | ICD-10-CM

## 2020-09-27 DIAGNOSIS — T783XXA Angioneurotic edema, initial encounter: Secondary | ICD-10-CM | POA: Diagnosis not present

## 2020-09-27 MED ORDER — VALSARTAN 80 MG PO TABS: 80.0000 mg | ORAL_TABLET | Freq: Every day | ORAL | 1 refills | Status: AC

## 2020-09-27 MED ORDER — METHYLPREDNISOLONE SODIUM SUCC 125 MG IJ SOLR
125.0000 mg | Freq: Once | INTRAMUSCULAR | Status: AC
  Administered 2020-09-27: 125 mg via INTRAMUSCULAR

## 2020-09-27 NOTE — ED Provider Notes (Signed)
[facility]    CSN: [id number] Arrival date & time: 09/27/20  1028      History   Chief Complaint Chief Complaint  Patient presents with   Facial Swelling    HPI Jeremy Gallegos is a 65 y.o. male.   HPI  Patient has swollen lips.  He states this is happened several times over the years.  He usually uses cool compresses and Benadryl.  He states he went to the emergency room initially.  They could not tell him why his lips were swollen.  They sent him to an allergist.  The allergist did allergy testing.  He could not tell him why his lips are swollen.  He cannot tell any prediction what will make his lip swelling.  No new food or product. Interestingly, this is been happening for about 6 years.  For about 10 years he has been on lisinopril for blood pressure.  It has not been mentioned to him that the lisinopril might be responsible for the lip swelling, or that the lip swelling (angioedema) might be dangerous. He never has change in voice.  He never has difficulty swallowing.  He never has wheezing or difficulty breathing.  This time, however, he has a rash on his right buttock.  It itches terribly Past Medical History:  Diagnosis Date   Hyperlipemia    Hyperlipidemia    Hypertension     Patient Active Problem List   Diagnosis Date Noted   Right lumbar radiculopathy 09/02/2013   HYPERLIPIDEMIA 08/16/2010   HYPERTENSION 08/16/2010   SHOULDER PAIN, LEFT 08/16/2010   CONTUSION OF SHOULDER REGION 08/16/2010    Past Surgical History:  Procedure Laterality Date   APPENDECTOMY     KNEE ARTHROSCOPY     TONSILLECTOMY         Home Medications    Prior to Admission medications   Medication Sig Start Date End Date Taking? Authorizing Provider  amLODipine (NORVASC) 2.5 MG tablet amlodipine 2.5 mg tablet   Yes [provider]  cholecalciferol (VITAMIN D) 1000 UNITS tablet Take 1,000 Units by mouth daily.   Yes [provider]  fenofibrate  micronized (LOFIBRA) 200 MG capsule Take 200 mg by mouth daily before breakfast.   Yes [provider]  glucosamine-chondroitin 500-400 MG tablet Take 1 tablet by mouth 2 (two) times daily.   Yes [provider]  meloxicam (MOBIC) 15 MG tablet One tab PO qAM with breakfast for 2 weeks, then daily prn pain. 09/02/13  Yes Monica Becton, MD  oxyCODONE-acetaminophen (PERCOCET) 10-325 MG per tablet Take 1 tablet by mouth every 4 (four) hours as needed for pain.   Yes [provider]  rosuvastatin (CRESTOR) 40 MG tablet rosuvastatin 40 mg tablet 09/30/19  Yes [provider]  triamterene-hydrochlorothiazide (MAXZIDE-25) 37.5-25 MG tablet triamterene 37.5 mg-hydrochlorothiazide 25 mg tablet   Yes [provider]  valsartan (DIOVAN) 80 MG tablet Take 1 tablet (80 mg total) by mouth daily. 09/27/20  Yes Eustace Moore, MD    Family History Family History  Problem Relation Age of Onset   Diabetes Mother    Heart failure Mother    Hypertension Mother    Heart failure Father    Hypertension Father    Diabetes Father     Social History Social History   Tobacco Use   Smoking status: Never   Smokeless tobacco: Never  Substance Use Topics   Alcohol use: No   Drug use: No  Allergies   Lisinopril   Review of Systems Review of Systems See HPI  Physical Exam Triage Vital Signs ED Triage Vitals  Enc Vitals Group     BP 09/27/20 1116 116/78     Pulse Rate 09/27/20 1116 60     Resp --      Temp 09/27/20 1116 98.3 F (36.8 C)     Temp Source 09/27/20 1116 Oral     SpO2 09/27/20 1116 95 %     Weight 09/27/20 1118 215 lb (97.5 kg)     Height 09/27/20 1118 5\' 8"  (1.727 m)     Head Circumference --      Peak Flow --      Pain Score 09/27/20 1118 0     Pain Loc --      Pain Edu? --      Excl. in GC? --    No data found.  Updated Vital Signs BP 116/78 (BP Location: Right Arm)   Pulse 60   Temp 98.3 F (36.8 C) (Oral)   Ht  5\' 8"  (1.727 m)   Wt 97.5 kg   SpO2 95%   BMI 32.69 kg/m      Physical Exam Constitutional:      General: He is not in acute distress.    Appearance: He is well-developed. He is not ill-appearing.     Comments: Heavyset.  No acute distress.  Pleasant  HENT:     Head: Normocephalic and atraumatic.     Right Ear: Tympanic membrane, ear canal and external ear normal.     Left Ear: Tympanic membrane, ear canal and external ear normal.     Nose: Nose normal. No rhinorrhea.     Mouth/Throat:     Comments: The lips are quite swollen, asymmetric right more than the left.  Upper and lower.  Oropharynx is benign.  Uvula is midline.  Voice is normal.  Lungs are clear to auscultation.  Heart regular with no murmur Eyes:     Conjunctiva/sclera: Conjunctivae normal.     Pupils: Pupils are equal, round, and reactive to light.  Cardiovascular:     Rate and Rhythm: Normal rate and regular rhythm.     Heart sounds: Normal heart sounds.     Comments: 1 ectopic beat and 60 seconds Pulmonary:     Effort: Pulmonary effort is normal. No respiratory distress.     Breath sounds: Normal breath sounds. No wheezing.  Abdominal:     General: There is no distension.     Palpations: Abdomen is soft.  Musculoskeletal:        General: Normal range of motion.     Cervical back: Normal range of motion.  Skin:    General: Skin is warm and dry.     Findings: Rash present.     Comments: Patch of erythematous papules/wheals on right buttock measures 6 cm across  Neurological:     General: No focal deficit present.     Mental Status: He is alert.  Psychiatric:        Mood and Affect: Mood normal.        Behavior: Behavior normal.     UC Treatments / Results  Labs (all labs ordered are listed, but only abnormal results are displayed) Labs Reviewed - No data to display  EKG   Radiology No results found.  Procedures Procedures (including critical care time)  Medications Ordered in UC Medications   methylPREDNISolone sodium succinate (SOLU-MEDROL) 125 mg/2 mL injection 125  mg (125 mg Intramuscular Given 09/27/20 1155)    Initial Impression / Assessment and Plan / UC Course  I have reviewed the triage vital signs and the nursing notes.  Pertinent labs & imaging results that were available during my care of the patient were reviewed by me and considered in my medical decision making (see chart for details).     I explained to the patient that this is angioedema, is caused by the lisinopril.  He must stop lisinopril and never take it again.  Okay to give him Solu-Medrol as well as recommending Benadryl and ice.  If he gets worse instead of better he must go to the emergency room.  I am going to put him on an ARB in place of the ACE inhibitor.  Losartan is not covered by his insurance.  We will try instead Diovan.  The importance of monitoring blood pressure while going through a blood pressure medication change is discussed. Final Clinical Impressions(s) / UC Diagnoses   Final diagnoses:  Angioedema, initial encounter  Adverse effect of lisinopril, initial encounter     Discharge Instructions      Stop lisinopril You can never take lisinopril or an ACE inhibitor again Use ice to reduce the swelling Take Benadryl 25 to 50 mg every 6 hours Call your doctor on Monday to report medication change Take your blood pressure at home to make sure new medicine is working   ED Prescriptions     Medication Sig Dispense Auth. Provider   valsartan (DIOVAN) 80 MG tablet Take 1 tablet (80 mg total) by mouth daily. 30 tablet Eustace Moore, MD      PDMP not reviewed this encounter.   Eustace Moore, MD 09/27/20 [phone]

## 2020-09-27 NOTE — Discharge Instructions (Addendum)
Stop lisinopril You can never take lisinopril or an ACE inhibitor again Use ice to reduce the swelling Take Benadryl 25 to 50 mg every 6 hours Call your doctor on Monday to report medication change Take your blood pressure at home to make sure new medicine is working

## 2020-09-27 NOTE — ED Triage Notes (Signed)
Patient states that he began having facial swelling, lips swelling last night about 10pm.  This has happened in the past.  Unsure of what it is.  No difficulty breathing, dizziness.  Theres a rash on his right hip he noticed this morning as well.  Patient has taken Benadryl 25mg  @ 12am.

## 2020-10-19 ENCOUNTER — Other Ambulatory Visit: Payer: Self-pay

## 2020-10-19 ENCOUNTER — Emergency Department
Admission: EM | Admit: 2020-10-19 | Discharge: 2020-10-19 | Disposition: A | Discharge status: Home / Self Care | Payer: Medicare Other | Source: Home / Self Care | Attending: Family Medicine | Admitting: Family Medicine

## 2020-10-19 DIAGNOSIS — U071 COVID-19: Secondary | ICD-10-CM

## 2020-10-19 NOTE — ED Triage Notes (Signed)
Pt st yesterday he started having generalized body aches yesterday. Fever and chills, pt took at home test yesterday that was positive and he would like to make sure. Pt st he has taken tylenol and a benadryl which has helped with the symptoms. Pt st he was able to put his leg in a compression bandage and heat that helped with the aches. The pt st he was vaccinated x3.

## 2020-10-19 NOTE — Discharge Instructions (Addendum)
Drink plenty of fluids Isolate for 5 days.  May retest in 5 days to see if your test is positive or negative.  Need to wear a mask for additional 5 days for a total of 10 days of protection Call for problems

## 2020-10-20 NOTE — ED Provider Notes (Signed)
[facility]    CSN: [id number] Arrival date & time: 10/19/20  1327      History   Chief Complaint Chief Complaint  Patient presents with   Generalized Body Aches    HPI Jeremy Gallegos is a 65 y.o. male.   HPI  Patient states that he is COVID vaccinated.  Yesterday he developed body aches fever and chills.  He did a COVID test this morning which was positive.  He is here today because he misunderstands this needs to be verified by the doctor.  I explained to him that a COVID test at home, with COVID symptoms, is adequate diagnosis for the COVID infection and that he can proceed to be cared for at home.  We discussed treatment with Paxlovid.  Patient states that his symptoms today are improved.  He does not feel sick enough to take medication, especially if there is a risk that he will rebound.  Since he is vaccinated, generally feels healthy I think this is reasonable.  He does have hypertension.  He is overweight but not morbidly obese.  No shortness of breath or cough at this time.  Past Medical History:  Diagnosis Date   Hyperlipemia    Hyperlipidemia    Hypertension     Patient Active Problem List   Diagnosis Date Noted   Right lumbar radiculopathy 09/02/2013   HYPERLIPIDEMIA 08/16/2010   HYPERTENSION 08/16/2010   SHOULDER PAIN, LEFT 08/16/2010   CONTUSION OF SHOULDER REGION 08/16/2010    Past Surgical History:  Procedure Laterality Date   APPENDECTOMY     KNEE ARTHROSCOPY     KNEE SURGERY Bilateral    TONSILLECTOMY         Home Medications    Prior to Admission medications   Medication Sig Start Date End Date Taking? Authorizing Provider  rosuvastatin (CRESTOR) 40 MG tablet rosuvastatin 40 mg tablet 09/30/19  Yes [provider]  amLODipine (NORVASC) 2.5 MG tablet amlodipine 2.5 mg tablet    [provider]  cholecalciferol (VITAMIN D) 1000 UNITS tablet Take 1,000 Units by mouth daily.    [provider]  fenofibrate  micronized (LOFIBRA) 200 MG capsule Take 200 mg by mouth daily before breakfast.    [provider]  glucosamine-chondroitin 500-400 MG tablet Take 1 tablet by mouth 2 (two) times daily.    [provider]  triamterene-hydrochlorothiazide (MAXZIDE-25) 37.5-25 MG tablet triamterene 37.5 mg-hydrochlorothiazide 25 mg tablet    [provider]  valsartan (DIOVAN) 80 MG tablet Take 1 tablet (80 mg total) by mouth daily. 09/27/20   Eustace Moore, MD    Family History Family History  Problem Relation Age of Onset   Diabetes Mother    Heart failure Mother    Hypertension Mother    Heart failure Father    Hypertension Father    Diabetes Father    Parkinson's disease Father     Social History Social History   Tobacco Use   Smoking status: Never   Smokeless tobacco: Never  Substance Use Topics   Alcohol use: No   Drug use: No     Allergies   Lisinopril   Review of Systems Review of Systems See HPI  Physical Exam Triage Vital Signs ED Triage Vitals  Enc Vitals Group     BP 10/19/20 1349 111/68     Pulse Rate 10/19/20 1349 81     Resp 10/19/20 1349 17     Temp 10/19/20 1349 99.5 F (37.5 C)  Temp Source 10/19/20 1349 Oral     SpO2 10/19/20 1349 99 %     Weight 10/19/20 1345 215 lb (97.5 kg)     Height 10/19/20 1345 5\' 8"  (1.727 m)     Head Circumference --      Peak Flow --      Pain Score 10/19/20 1345 2     Pain Loc --      Pain Edu? --      Excl. in GC? --    No data found.  Updated Vital Signs BP 110/72 Comment: pt st this is low for him  Pulse 81   Temp 99.5 F (37.5 C) (Oral)   Resp 17   Ht 5\' 8"  (1.727 m)   Wt 97.5 kg   SpO2 99%   BMI 32.69 kg/m       Physical Exam Constitutional:      General: He is not in acute distress.    Appearance: He is well-developed.     Comments: Appears tired.  No acute distress  HENT:     Head: Normocephalic and atraumatic.     Mouth/Throat:     Comments: Mask is in place Eyes:      Conjunctiva/sclera: Conjunctivae normal.     Pupils: Pupils are equal, round, and reactive to light.  Cardiovascular:     Rate and Rhythm: Normal rate and regular rhythm.     Heart sounds: Normal heart sounds.  Pulmonary:     Effort: Pulmonary effort is normal. No respiratory distress.     Breath sounds: Normal breath sounds. No wheezing or rales.  Abdominal:     General: There is no distension.     Palpations: Abdomen is soft.  Musculoskeletal:        General: Normal range of motion.     Cervical back: Normal range of motion.  Skin:    General: Skin is warm and dry.  Neurological:     Mental Status: He is alert.  Psychiatric:        Mood and Affect: Mood normal.        Behavior: Behavior normal.     UC Treatments / Results  Labs (all labs ordered are listed, but only abnormal results are displayed) Labs Reviewed - No data to display  EKG   Radiology No results found.  Procedures Procedures (including critical care time)  Medications Ordered in UC Medications - No data to display  Initial Impression / Assessment and Plan / UC Course  I have reviewed the triage vital signs and the nursing notes.  Pertinent labs & imaging results that were available during my care of the patient were reviewed by me and considered in my medical decision making (see chart for details).     Reviewed home treatment for COVID.  Instructions are given to him.  Discussed quarantine.  Discussed reasons for return Final Clinical Impressions(s) / UC Diagnoses   Final diagnoses:  COVID-19     Discharge Instructions      Drink plenty of fluids Isolate for 5 days.  May retest in 5 days to see if your test is positive or negative.  Need to wear a mask for additional 5 days for a total of 10 days of protection Call for problems   ED Prescriptions   None    PDMP not reviewed this encounter.   12/19/20, MD 10/20/20 [id number]
